# Patient Record
Sex: Male | Born: 1940 | Hispanic: Yes | Marital: Married | Smoking: Never smoker
Health system: Southern US, Community
[De-identification: ages and names within clinical notes are randomized; demographics above are authoritative.]

---

## 2010-05-01 ENCOUNTER — Inpatient Hospital Stay: Payer: Self-pay | Admitting: Internal Medicine

## 2016-06-19 ENCOUNTER — Emergency Department
Admission: EM | Admit: 2016-06-19 | Discharge: 2016-06-19 | Disposition: A | Discharge status: Home / Self Care | Payer: 59 | Attending: Emergency Medicine | Admitting: Emergency Medicine

## 2016-06-19 ENCOUNTER — Encounter: Payer: Self-pay | Admitting: *Deleted

## 2016-06-19 ENCOUNTER — Emergency Department: Payer: 59

## 2016-06-19 DIAGNOSIS — M7918 Myalgia, other site: Secondary | ICD-10-CM

## 2016-06-19 DIAGNOSIS — M79605 Pain in left leg: Secondary | ICD-10-CM | POA: Diagnosis not present

## 2016-06-19 DIAGNOSIS — R200 Anesthesia of skin: Secondary | ICD-10-CM | POA: Insufficient documentation

## 2016-06-19 LAB — BASIC METABOLIC PANEL
Anion gap: 8 (ref 5–15)
BUN: 15 mg/dL (ref 6–20)
CALCIUM: 9.2 mg/dL (ref 8.9–10.3)
CO2: 24 mmol/L (ref 22–32)
CREATININE: 0.59 mg/dL — AB (ref 0.61–1.24)
Chloride: 108 mmol/L (ref 101–111)
GFR calc non Af Amer: >60 mL/min (ref >60)
GLUCOSE: 97 mg/dL (ref 65–99)
Potassium: 4 mmol/L (ref 3.5–5.1)
Sodium: 140 mmol/L (ref 135–145)

## 2016-06-19 LAB — CK: CK TOTAL: 152 U/L (ref 49–397)

## 2016-06-19 LAB — CBC
HCT: 42.1 % (ref 40.0–52.0)
Hemoglobin: 14.7 g/dL (ref 13.0–18.0)
MCH: 32.1 pg (ref 26.0–34.0)
MCHC: 35 g/dL (ref 32.0–36.0)
MCV: 91.8 fL (ref 80.0–100.0)
PLATELETS: 229 10*3/uL (ref 150–440)
RBC: 4.59 MIL/uL (ref 4.40–5.90)
RDW: 13.3 % (ref 11.5–14.5)
WBC: 5.4 10*3/uL (ref 3.8–10.6)

## 2016-06-19 MED ORDER — MORPHINE SULFATE (PF) 2 MG/ML IV SOLN
4.0000 mg | Freq: Once | INTRAVENOUS | Status: AC
  Administered 2016-06-19: 4 mg via INTRAMUSCULAR
  Filled 2016-06-19: qty 2

## 2016-06-19 MED ORDER — HYDROCODONE-ACETAMINOPHEN 5-325 MG PO TABS: 1.0000 | ORAL_TABLET | ORAL | 0 refills | Status: AC | PRN

## 2016-06-19 NOTE — ED Notes (Signed)
Pt transported to CT and then to US.

## 2016-06-19 NOTE — ED Triage Notes (Signed)
Interpreter at bedside, pt states pain behind his left knee, states he feels "like his tendons are tight" states numbness in his lower ext for 1 month, states left arm pain and difficulty grabbing things, pt awake and alert in no acute distress, family states he drags his left foot when walking, denies any bladder/bowel problems, denies any hx of CVA

## 2016-06-19 NOTE — ED Notes (Signed)
Request for interpreter placed. 

## 2016-06-19 NOTE — ED Provider Notes (Signed)
[facility] Emergency Department Provider Note  Time seen: 4:55 PM  I have reviewed the triage vital signs and the nursing notes.   HISTORY  Chief Complaint Numbness    HPI Dwayne Martin is a 75 y.o. male with no past medical history who presents to the emergency department for left leg and arm pain/numbness. According to the patient for the past 2 months he has been experiencing pain and numbness in the left arm, hand and left leg. States the pain has gotten worse over the past one week. States he is not able to strongly grip objects in the left hand and things often fall out of his left hand when trying to hold things. Patient does not have a doctor, but denies any medical problems. Patient is Spanish-speaking and an interpreter was used for this evaluation. Denies any chest pain. Patient's main concern at this time is left leg and arm discomfort.  History reviewed. No pertinent past medical history.  There are no active problems to display for this patient.   History reviewed. No pertinent surgical history.  Prior to Admission medications   Not on File    No Known Allergies  History reviewed. No pertinent family history.  Social History Social History  Substance Use Topics  . Smoking status: Never Smoker  . Smokeless tobacco: Never Used  . Alcohol use No    Review of Systems Constitutional: Negative for fever. Cardiovascular: Negative for chest pain. Respiratory: Negative for shortness of breath. Gastrointestinal: Negative for abdominal pain Genitourinary: Negative for dysuria. Musculoskeletal: Left leg and arm discomfort and numbness Neurological: Negative for headache. Numbness of left arm and left leg. 10-point ROS otherwise negative.  ____________________________________________   PHYSICAL EXAM:  VITAL SIGNS: ED Triage Vitals  Enc Vitals Group     BP 06/19/16 1221 (!) 133/98     Pulse Rate 06/19/16 1221 84     Resp 06/19/16  1221 18     Temp 06/19/16 1221 98.6 F (37 C)     Temp Source 06/19/16 1221 Oral     SpO2 06/19/16 1221 95 %     Weight 06/19/16 1222 160 lb (72.6 kg)     Height 06/19/16 1222 5\' 5"  (1.651 m)     Head Circumference --      Peak Flow --      Pain Score 06/19/16 1235 8     Pain Loc --      Pain Edu? --      Excl. in GC? --     Constitutional: Alert and oriented. Well appearing and in no distress. Eyes: Normal exam ENT   Head: Normocephalic and atraumatic   Mouth/Throat: Mucous membranes are moist. Cardiovascular: Normal rate, regular rhythm. No murmur Respiratory: Normal respiratory effort without tachypnea nor retractions. Breath sounds are clear Gastrointestinal: Soft and nontender. No distention.  Musculoskeletal: Nontender with normal range of motion in all extremities. Significant varicose veins to lower extremities left greater than right. Neurologic:  Normal speech and language. Patient has 4/5 strength in left grip, with mild pronator drift in the left upper extremity. Patient has 4/5 motor in left lower extremity with moderate drift. Subjectively states decreased sensation on the left side. No cranial nerve deficits. Skin:  Skin is warm, dry and intact.  Psychiatric: Mood and affect are normal.  ____________________________________________   RADIOLOGY  Ultrasound negative for DVT. CT negative for abnormality.  ____________________________________________   INITIAL IMPRESSION / ASSESSMENT AND PLAN / ED COURSE  Pertinent labs &  imaging results that were available during my care of the patient were reviewed by me and considered in my medical decision making (see chart for details).  Patient presents the emergency department with left-sided decreased sensation and pain ongoing for 2 months but worse over the past one week. We will obtain a CT scan to rule out old CVA. Patient also states increased left lower extremity pain, has significant varicose veins left  lower extremity. We'll obtain an ultrasound of the left lower extremity to rule out DVT. Patient's labs are largely within normal limits including total CK.  Labs are largely within normal limits including CK. Ultrasound and CT show no acute abnormality. We will discharge with a short course of pain medication, and have the patient follow-up with neurology as an outpatient. Patient is agreeable to this plan.  ____________________________________________   FINAL CLINICAL IMPRESSION(S) / ED DIAGNOSES  Left leg/arm numbness/weakness Musculoskeletal pain   Minna AntisKevin Marlin Brys, MD 06/19/16 2018

## 2016-06-19 NOTE — ED Notes (Addendum)
Pt has been having numbness and pain in LUE and LLE. Pt has more pain with movement. Pt's daughter is stating that he has been unable to grip with his left hand. Pt has been having these problems for about 1 month. Pt is having pain in upper posterior thigh. Dr. Lenard LancePaduchowski at bedside.

## 2016-06-19 NOTE — ED Notes (Signed)
Medical interpreter paged for triage

## 2016-06-19 NOTE — Discharge Instructions (Signed)
Please call the number provided for neurology to arrange a follow-up appointment as soon as possible. Your workup today in the emergency department and show normal results. Please take your pain medication as needed, but only as prescribed. Return to the emergency department for any worsening pain, confusion, slurred speech, new weakness/numbness or any other symptom personally concerning to your self.

## 2016-07-01 ENCOUNTER — Ambulatory Visit: Admission: RE | Admit: 2016-07-01 | Payer: 59 | Source: Ambulatory Visit

## 2016-07-01 ENCOUNTER — Other Ambulatory Visit: Payer: Self-pay | Admitting: Neurology

## 2016-07-01 DIAGNOSIS — R202 Paresthesia of skin: Principal | ICD-10-CM

## 2016-07-01 DIAGNOSIS — R200 Anesthesia of skin: Secondary | ICD-10-CM

## 2017-04-18 IMAGING — US US EXTREM LOW VENOUS*L*
1 series · 13 of 24 positions shown · non-contrast
Comparison: None.

CLINICAL DATA: 75-year-old male with a history of left leg pain



[Series 1: us extrem low venous*left* · 0.08mm/px · 13 of 32 slices shown]
[im 1/32]
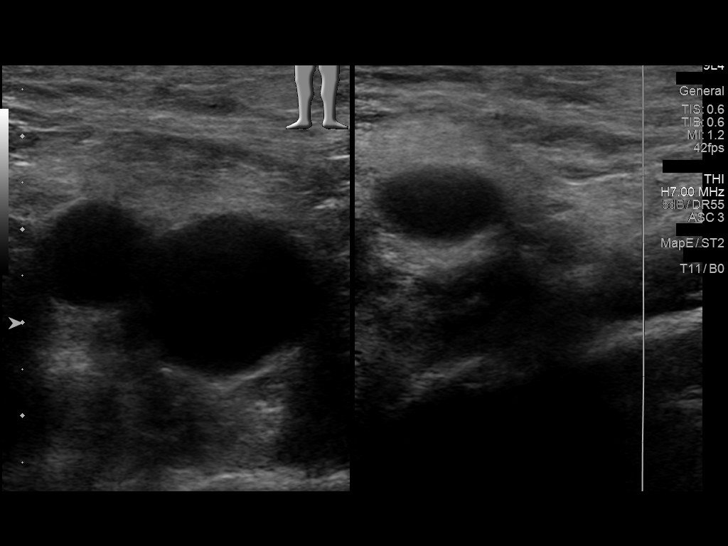
[im 3/32]
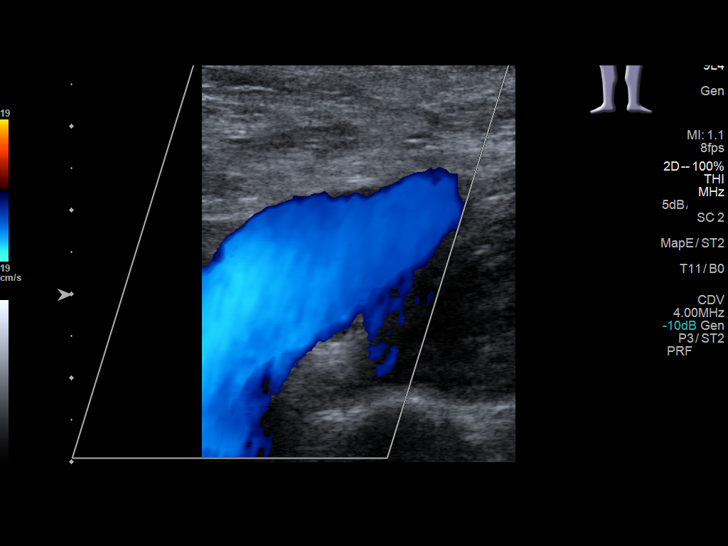
[im 6/32]
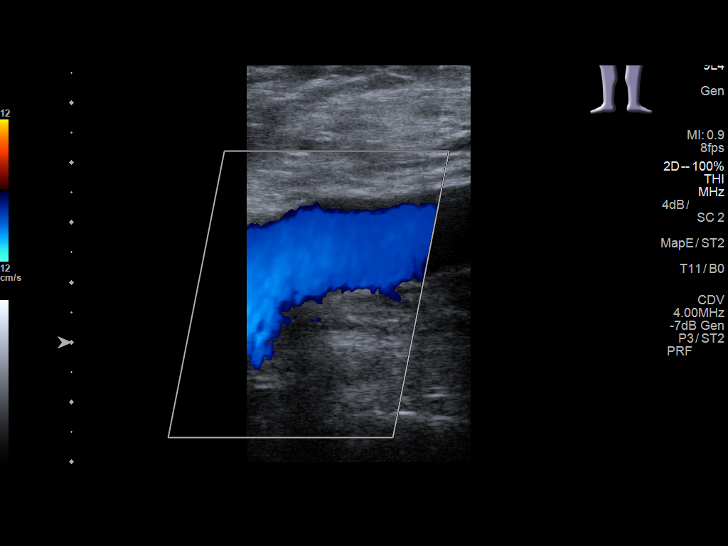
[im 9/32]
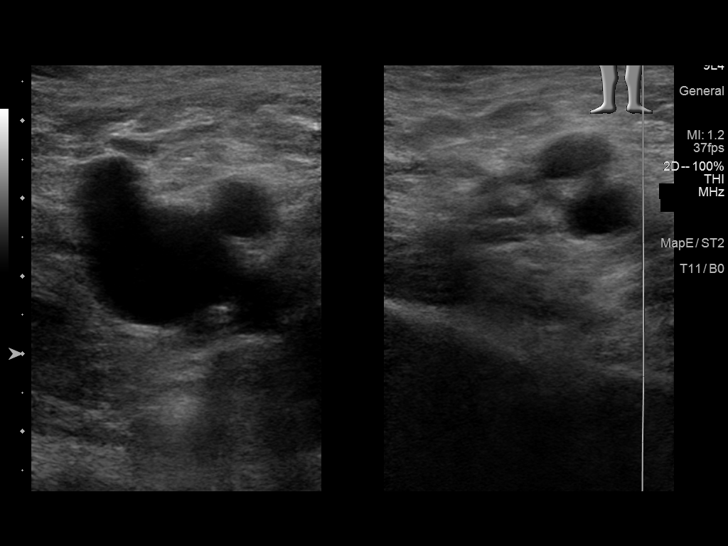
[im 11/32]
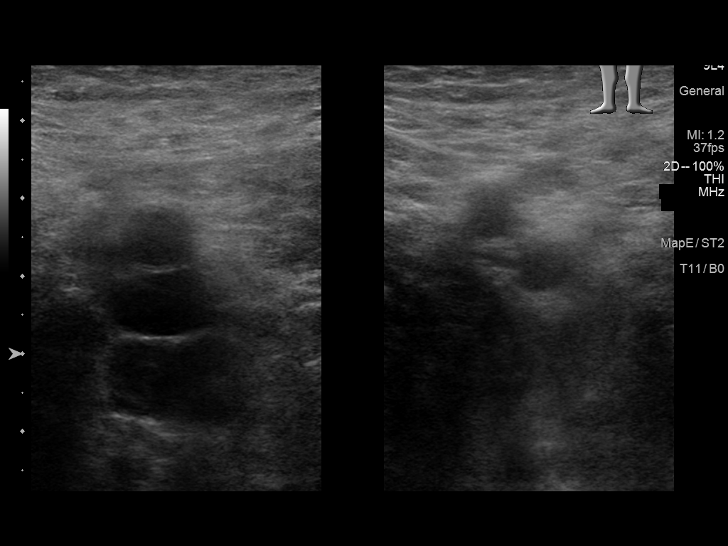
[im 14/32]
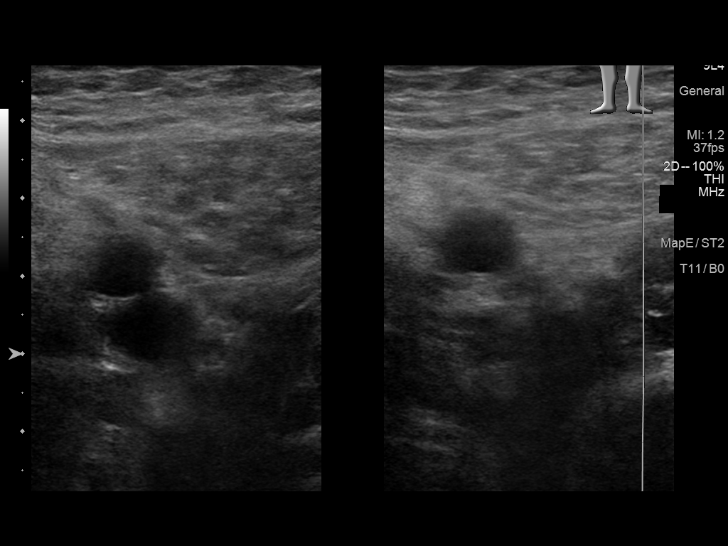
[im 17/32]
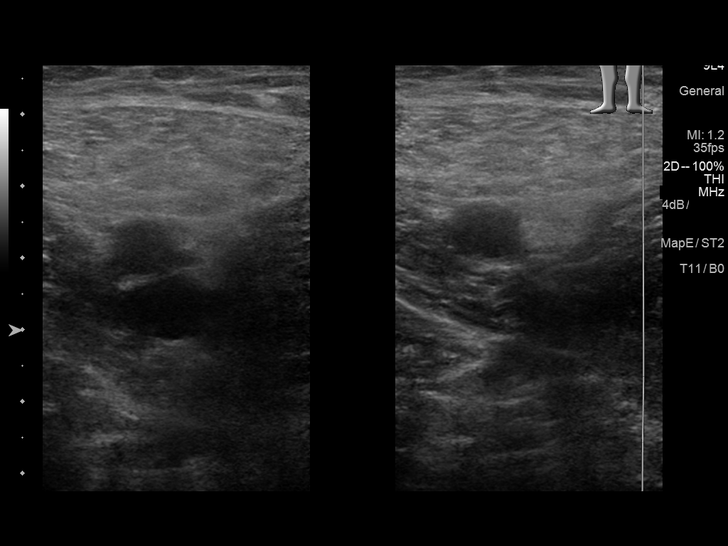
[im 18/32]
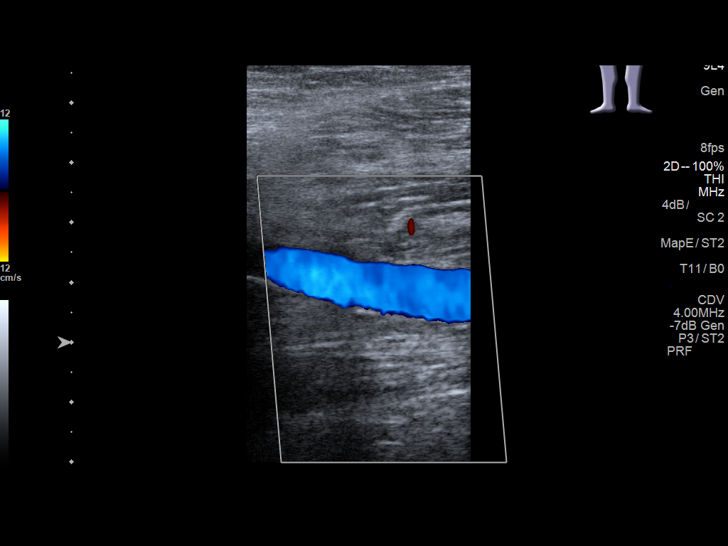
[im 21/32]
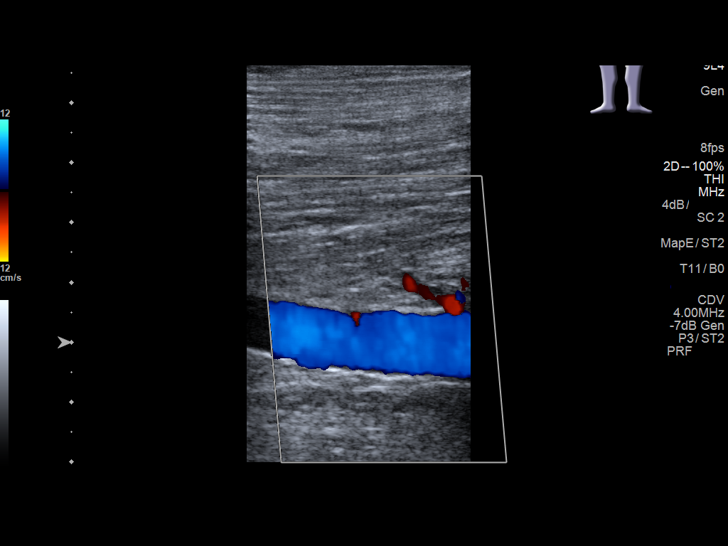
[im 23/32]
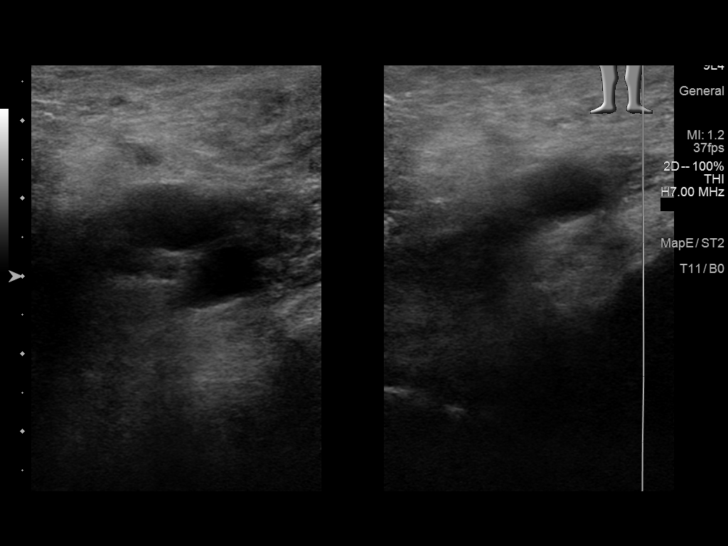
[im 26/32]
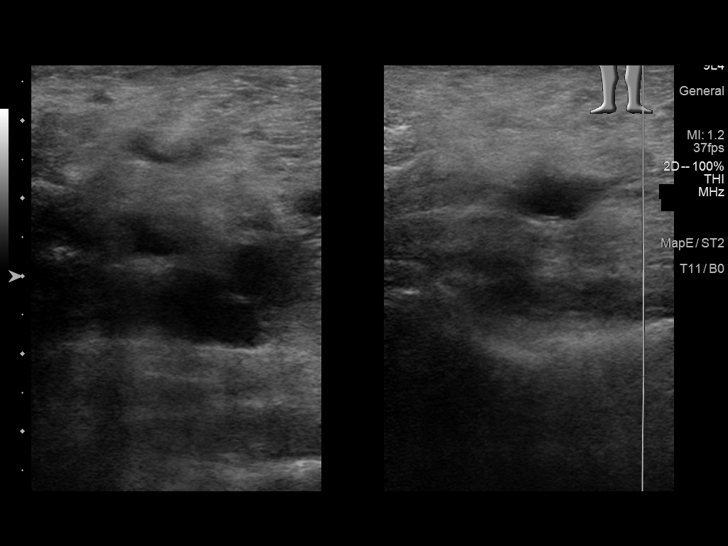
[im 29/32]
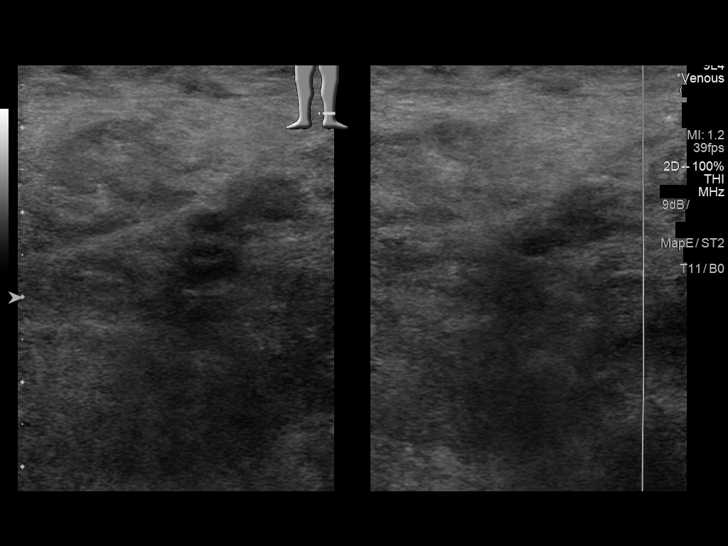
[im 32/32]
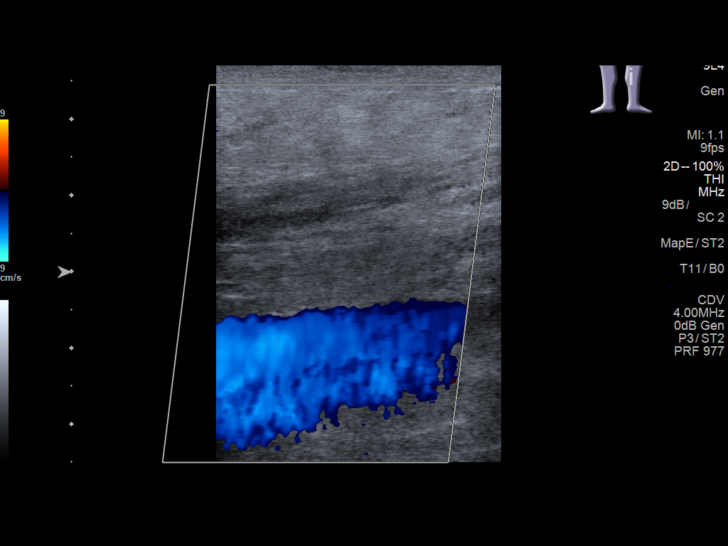

[13 of 24 positions shown; findings below may reference images not displayed]

FINDINGS: Contralateral Common Femoral Vein: Respiratory phasicity is normal
and symmetric with the symptomatic side. No evidence of thrombus.
Normal compressibility.

Common Femoral Vein: No evidence of thrombus. Normal
compressibility, respiratory phasicity and response to augmentation.

Saphenofemoral Junction: No evidence of thrombus. Normal
compressibility and flow on color Doppler imaging.

Profunda Femoral Vein: No evidence of thrombus. Normal
compressibility and flow on color Doppler imaging.

Femoral Vein: No evidence of thrombus. Normal compressibility,
respiratory phasicity and response to augmentation.

Popliteal Vein: No evidence of thrombus. Normal compressibility,
respiratory phasicity and response to augmentation.

Calf Veins: No evidence of thrombus. Normal compressibility and flow
on color Doppler imaging.

Superficial Great Saphenous Vein: No evidence of thrombus. Normal
compressibility and flow on color Doppler imaging.

Other Findings:  None.
IMPRESSION: Sonographic survey of the left lower extremity negative for DVT.

## 2017-07-21 IMAGING — CT CT HEAD W/O CM
3 series · 15 of 44 positions shown, 18 images · non-contrast
Comparison: None.

CLINICAL DATA: Numbness and pain in the left upper and lower
extremities

EXAM:
CT HEAD WITHOUT CONTRAST
TECHNIQUE: Contiguous axial images were obtained from the base of the skull
through the vertex without intravenous contrast.

[Series 3: head wo · axial · 0.39mm/px · z∈[-105,+5]mm · 9 of 27 slices shown, 12 images]
[im 3/27  brain]
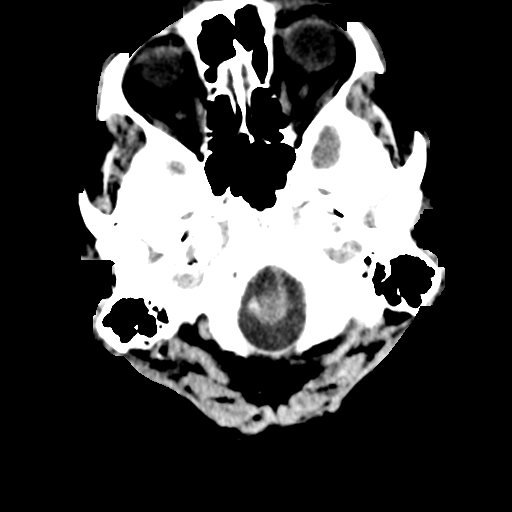
[im 3/27  bone]
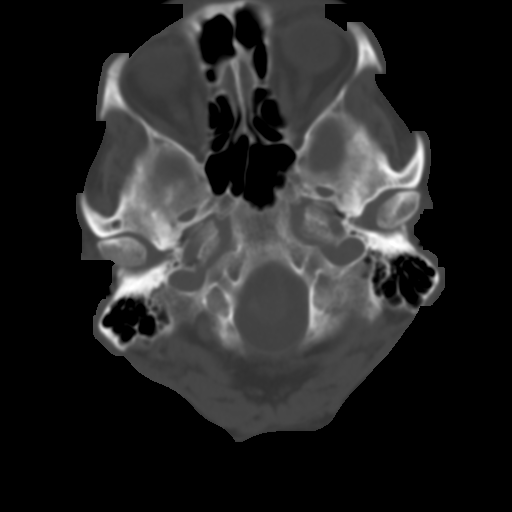
[im 6/27  brain]
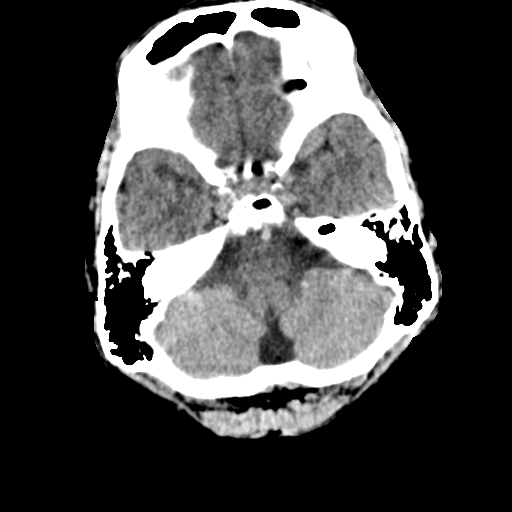
[im 8/27  brain]
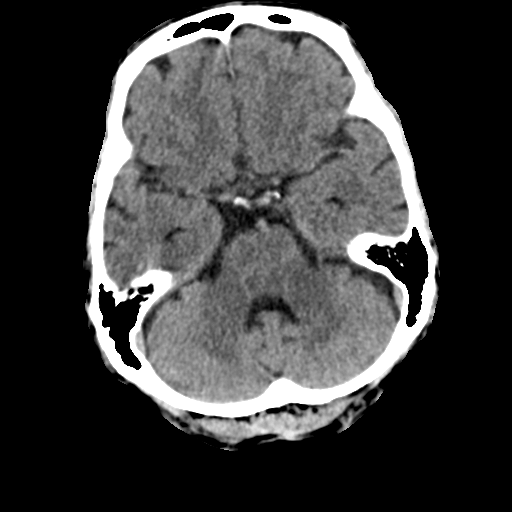
[im 11/27  brain]
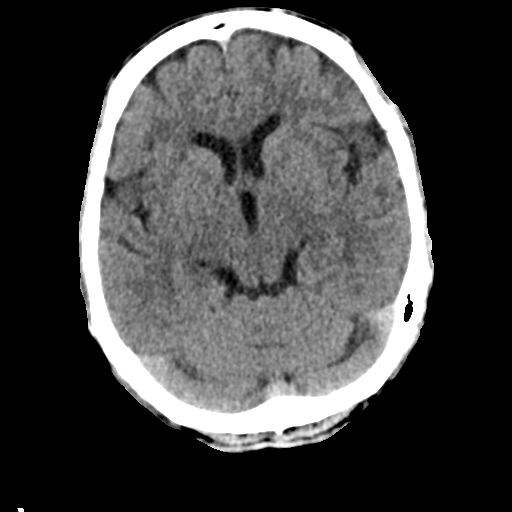
[im 14/27  brain]
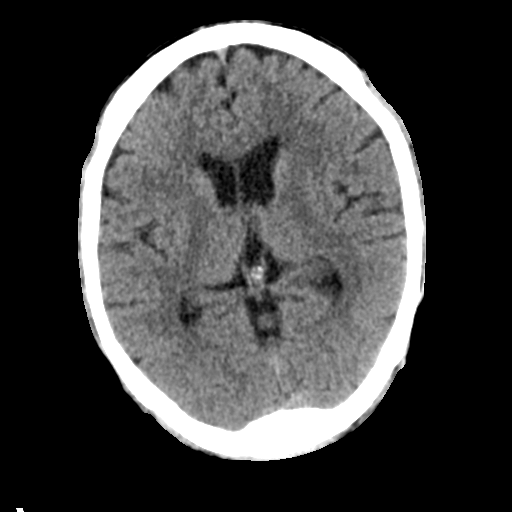
[im 14/27  bone]
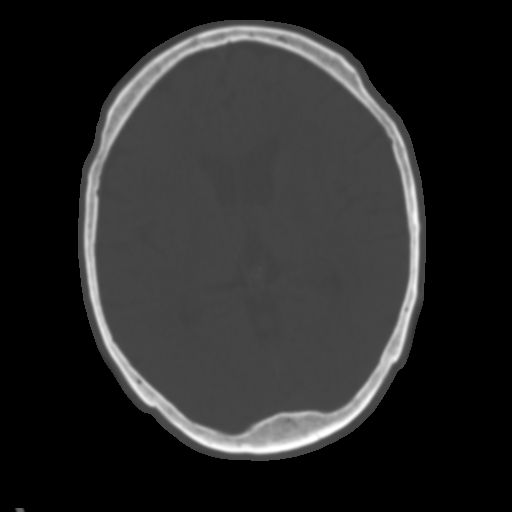
[im 17/27  brain]
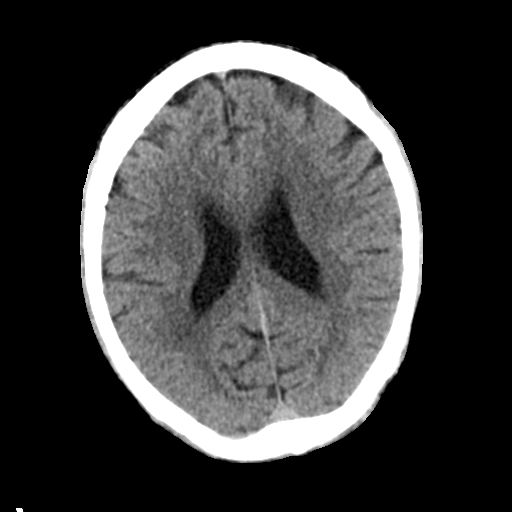
[im 20/27  brain]
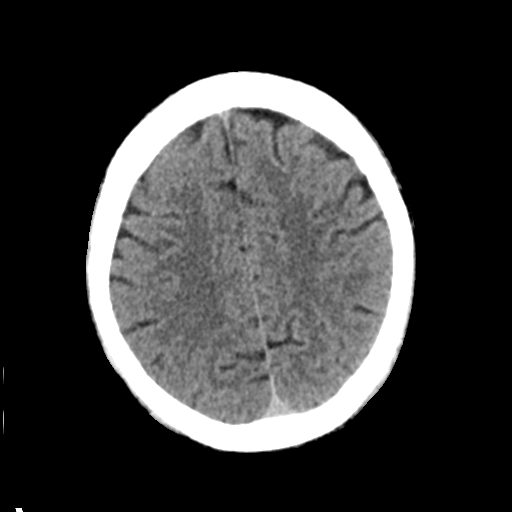
[im 22/27  brain]
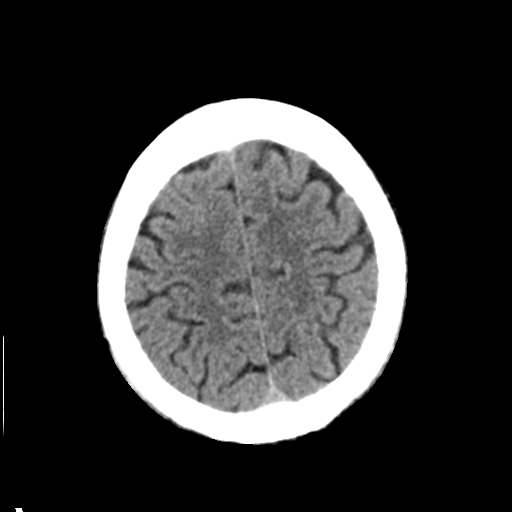
[im 25/27  brain]
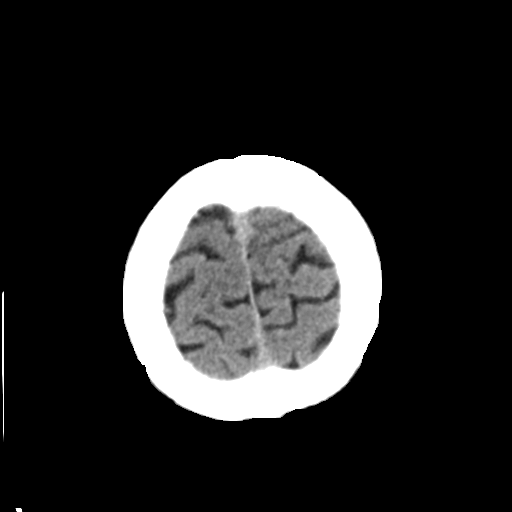
[im 25/27  bone]
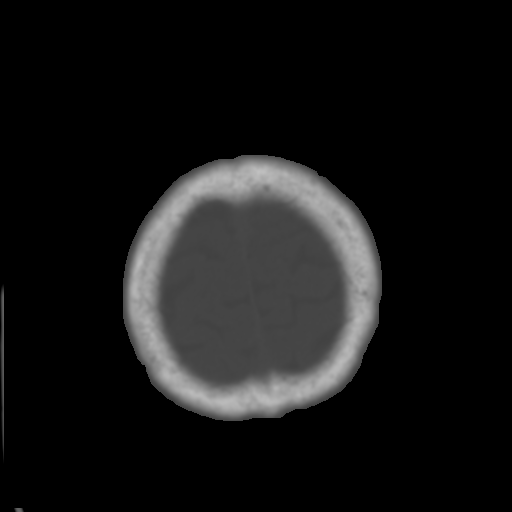

[Series 5: coronal soft tissue · coronal · 0.26mm/px · 3 of 60 slices shown]
[im 20/60  brain]
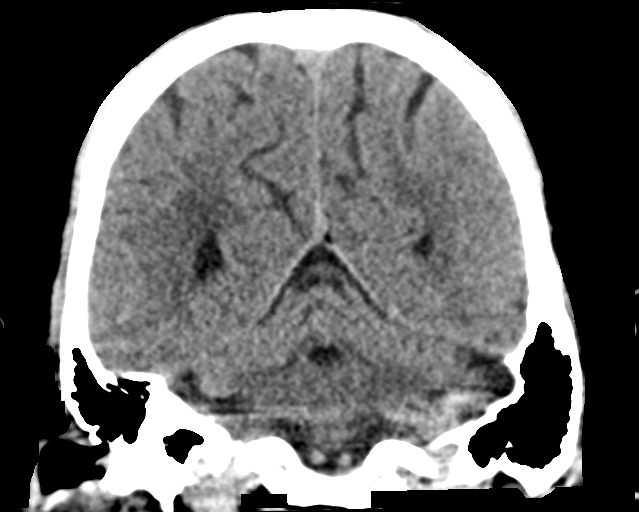
[im 27/60  brain]
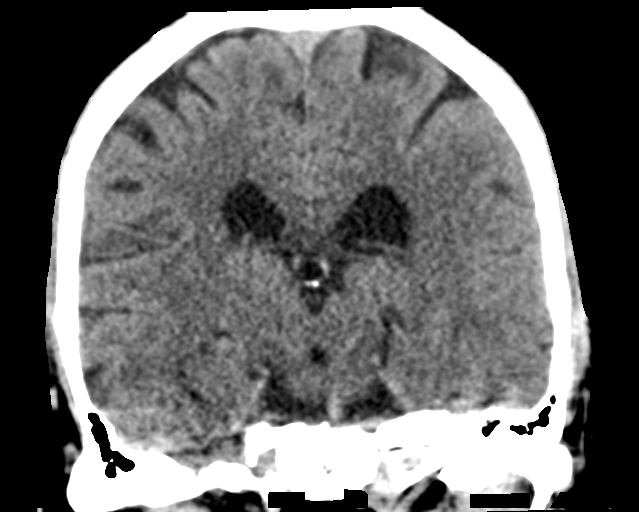
[im 33/60  brain]
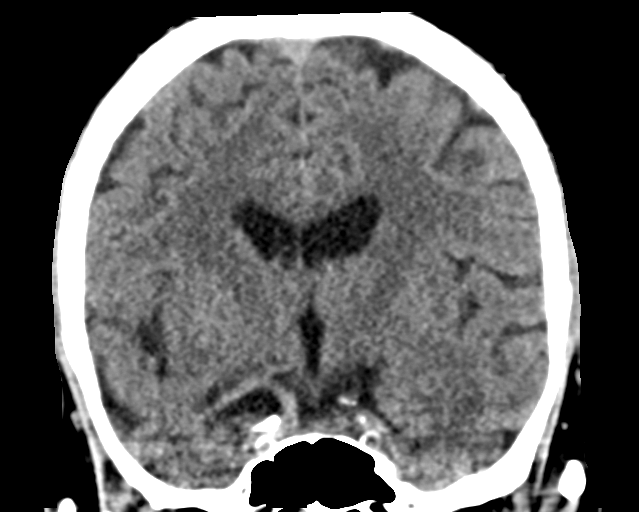

[Series 6: sagittal soft tissue · sagittal · 0.27mm/px · 3 of 48 slices shown]
[im 16/48  brain]
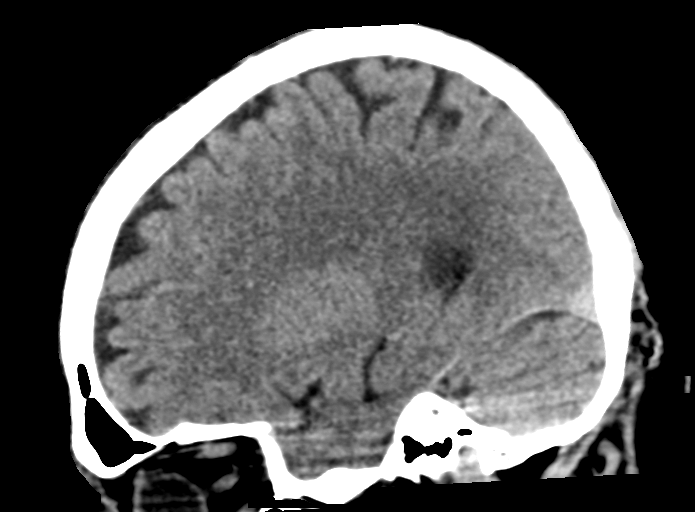
[im 24/48  brain]
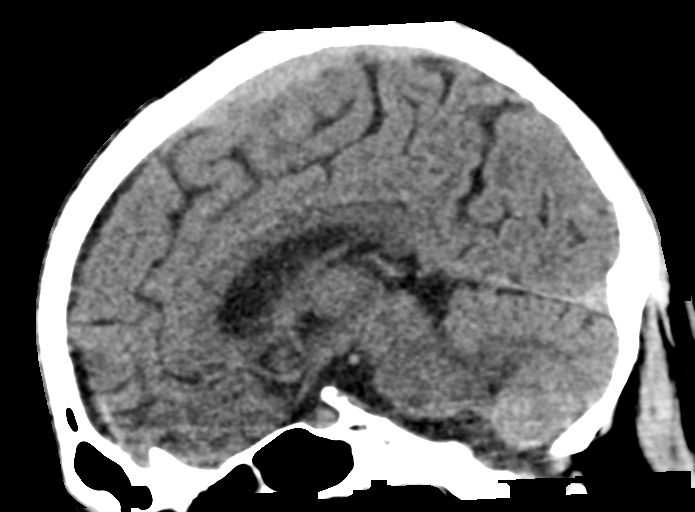
[im 32/48  brain]
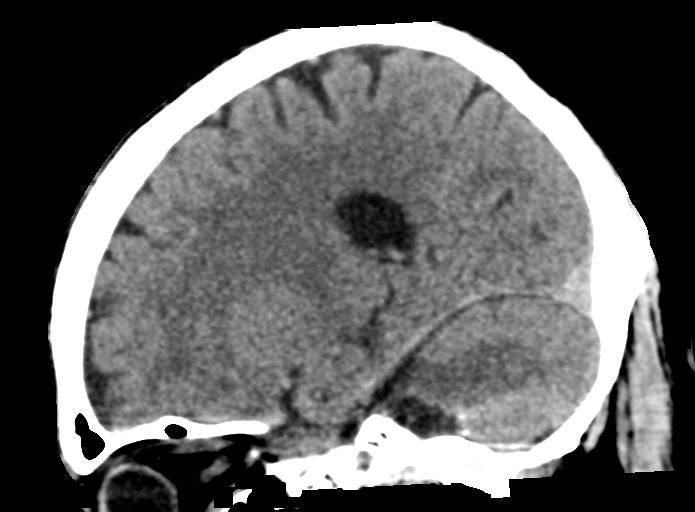

[15 of 44 positions shown; findings below may reference images not displayed]

FINDINGS: Brain: No evidence of acute infarction, hemorrhage, hydrocephalus,
extra-axial collection or mass lesion/mass effect.

Vascular: No hyperdense vessels. Mild carotid artery calcifications
at the skullbase.

Skull: Normal. Negative for fracture or focal lesion.

Sinuses/Orbits: No acute finding.

Other: None
IMPRESSION: No CT evidence for acute intracranial abnormality
